# Patient Record
Sex: Male | Born: 1970 | Race: White | Hispanic: No
Health system: Southern US, Community
[De-identification: ages and names within clinical notes are randomized; demographics above are authoritative.]

---

## 2007-07-25 ENCOUNTER — Inpatient Hospital Stay: Payer: Self-pay | Admitting: Surgery

## 2007-08-10 ENCOUNTER — Ambulatory Visit: Payer: Self-pay | Admitting: Surgery

## 2007-10-19 ENCOUNTER — Ambulatory Visit: Payer: Self-pay | Admitting: Surgery

## 2008-12-29 IMAGING — CR DG CHEST 1V PORT
1 series · 1 of 1 positions shown · non-contrast
Comparison: none

REASON FOR EXAM: PTX
COMMENTS:

PROCEDURE:     DXR - DXR PORTABLE CHEST SINGLE VIEW  - July 31, 2007  [DATE]
RESULT:     Comparison: 07/31/2007 at [DATE].

[view not recorded]
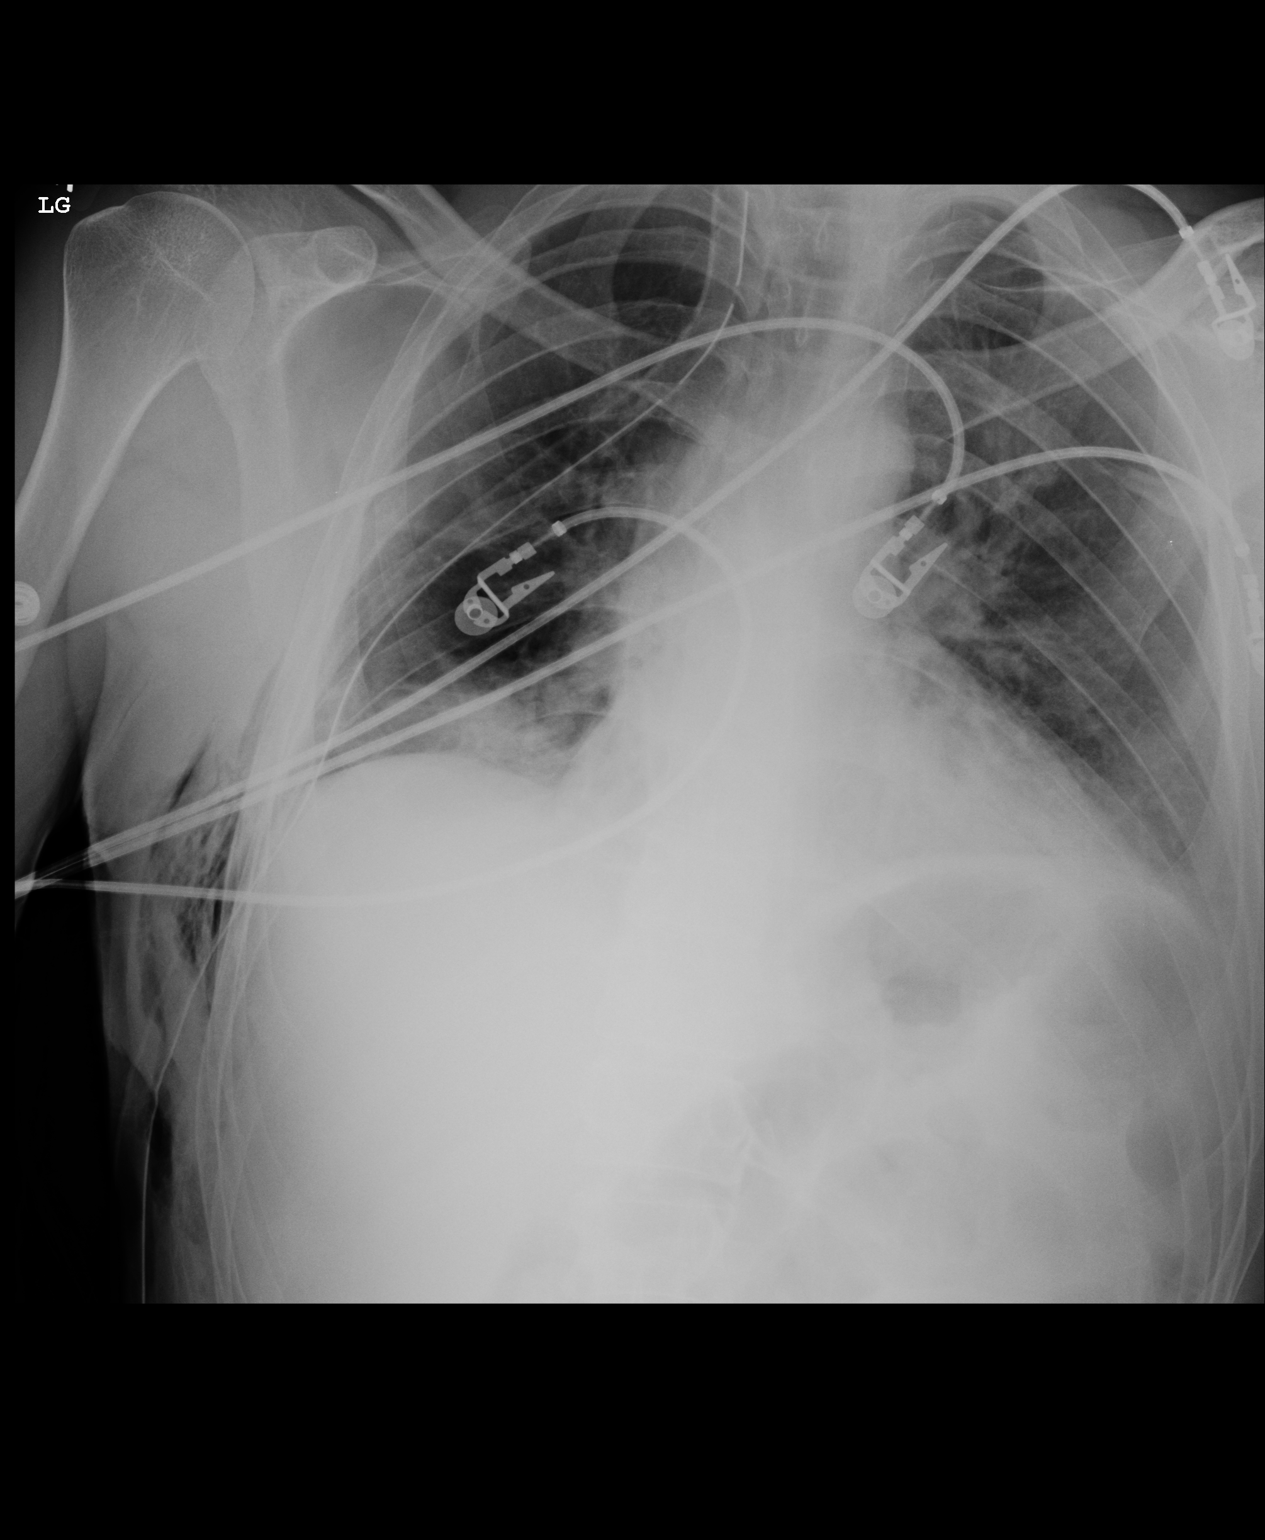

[1 of 1 positions shown; findings below may reference images not displayed]

FINDINGS: New right chest tube with tip at the apical right hemithorax. No significant
residual pneumothorax. Associated subcutaneous gas is noted. Bibasilar
opacities are likely due to atelectasis although superimposed pneumonia is
not excluded. No significant pulmonary edema. Stable cardiomediastinal
silhouette.
IMPRESSION: 1. Please see above.

## 2008-12-29 IMAGING — CR DG CHEST 1V PORT
1 series · 1 of 1 positions shown · non-contrast
Comparison: none

REASON FOR EXAM: ptx
COMMENTS:

PROCEDURE:     DXR - DXR PORTABLE CHEST SINGLE VIEW  - July 31, 2007  [DATE]
RESULT:     Comparison: 07/30/2007.

[view not recorded]
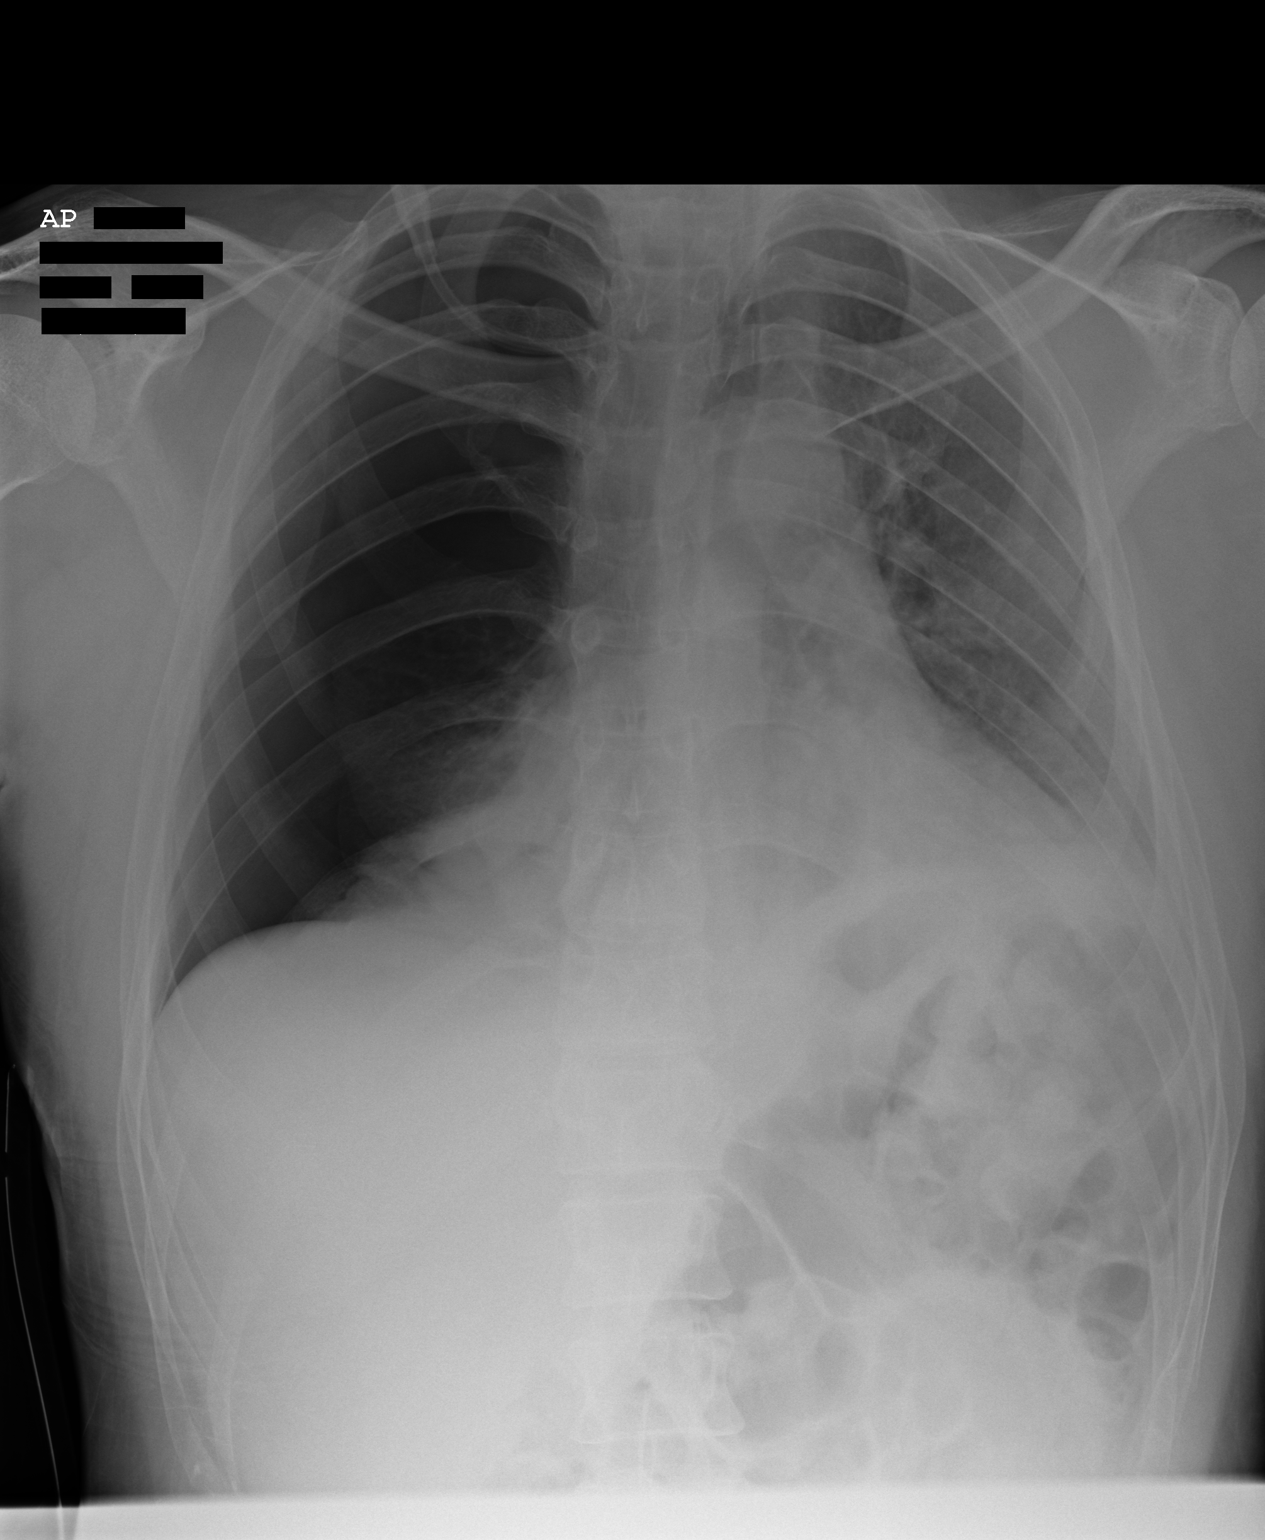

[1 of 1 positions shown; findings below may reference images not displayed]

FINDINGS: Interval removal of right chest tube. Right pneumothorax has increased to
large. Medial right basilar opacities are likely due to collapsed lung. The
left lung is clear. Patient is rotated. There is likely no significant
mediastinal shift.
IMPRESSION: 1. Interval removal of right chest tube. Right pneumothorax has increased to
large.

Findings were discussed with Dr. Mdjabed at [DATE] on 07/31/2007.

## 2008-12-30 IMAGING — CR DG CHEST 1V PORT
1 series · 1 of 1 positions shown · non-contrast
Comparison: none

REASON FOR EXAM: PTX
COMMENTS:

[view not recorded]
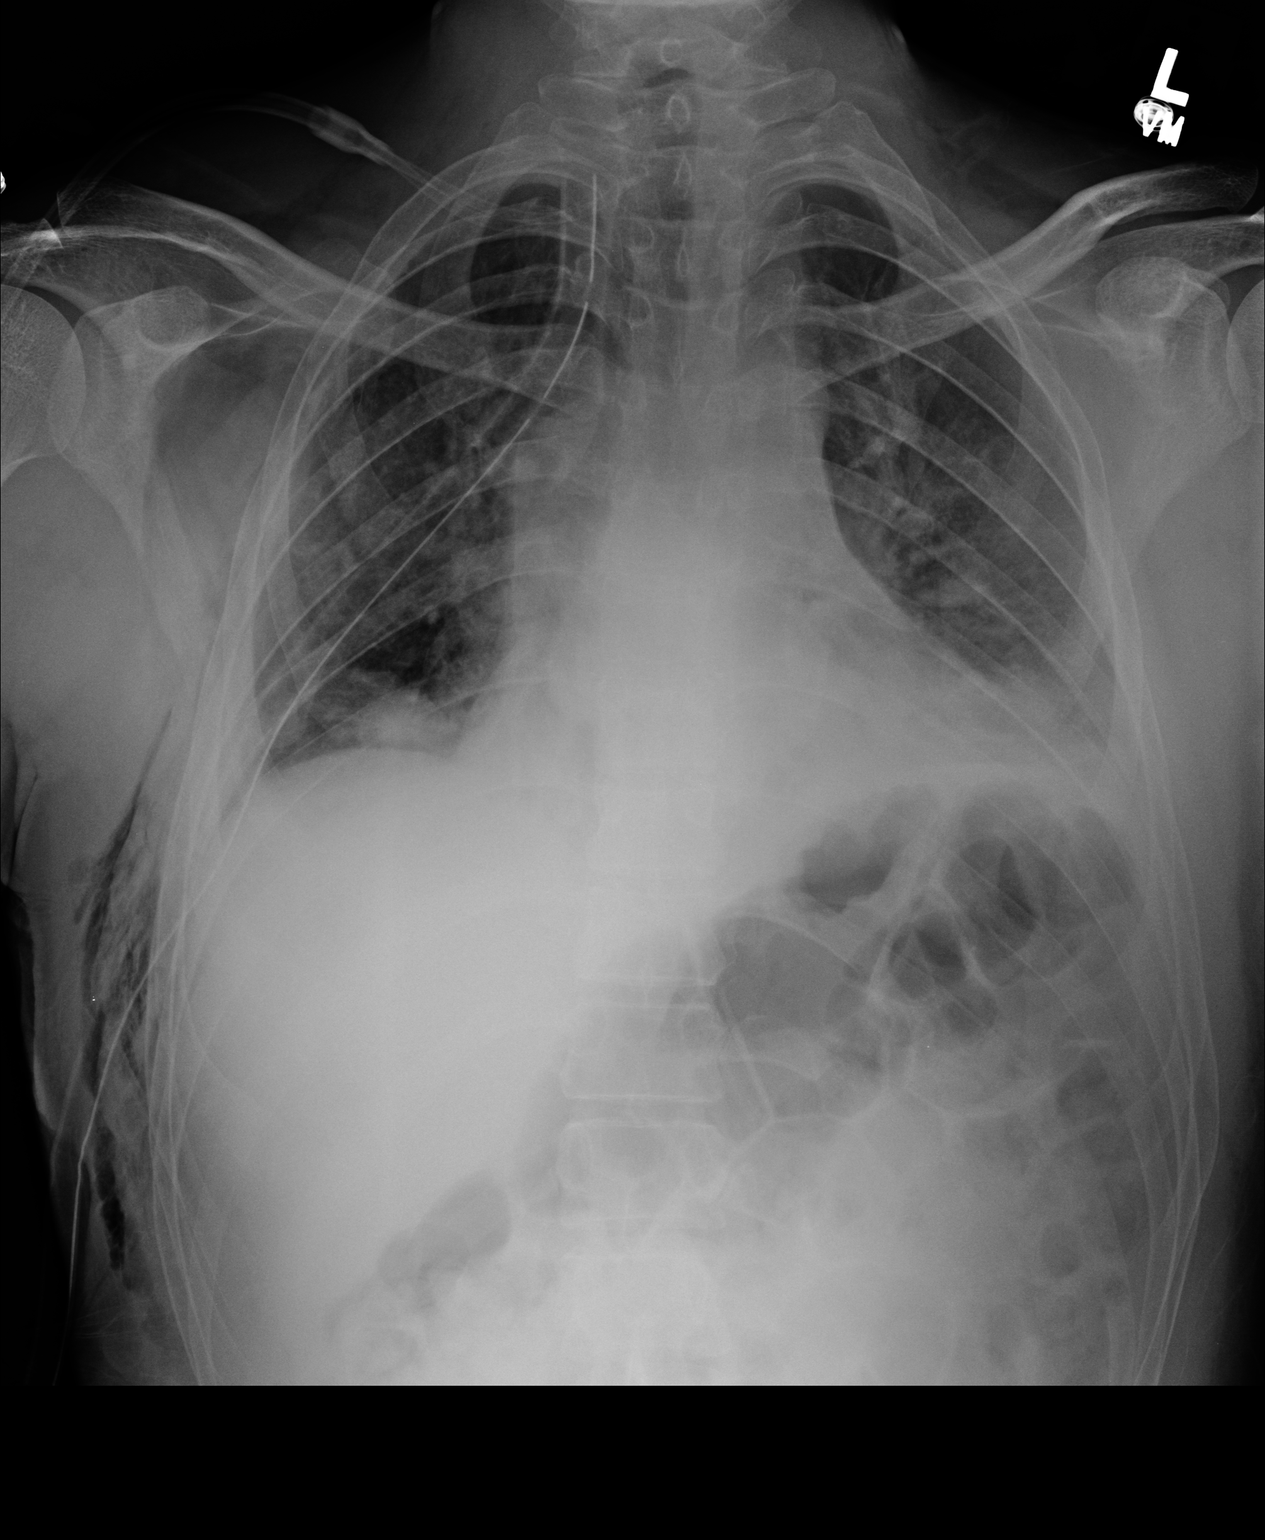

[1 of 1 positions shown; findings below may reference images not displayed]

PROCEDURE:     DXR - DXR PORTABLE CHEST SINGLE VIEW  - August 01, 2007  [DATE]

RESULT:     Comparison is made to a prior exam of 07/31/2007.

A RIGHT chest tube is present. No definite residual or recurrent RIGHT
pneumothorax is seen. There is increased density at both lung bases
compatible with bibasilar atelectasis. The atelectasis on the LEFT has
increased since the prior exam. The heart size is normal. No pleural
effusion is seen. Subcutaneous emphysema is present along the RIGHT lateral
hemithorax.
IMPRESSION: 1.  Please see above.
2.  No residual or recurrent pneumothorax is seen.

## 2009-01-01 IMAGING — CR DG CHEST 1V PORT
1 series · 1 of 1 positions shown · non-contrast
Comparison: none

REASON FOR EXAM: PTX
COMMENTS:

[view not recorded]
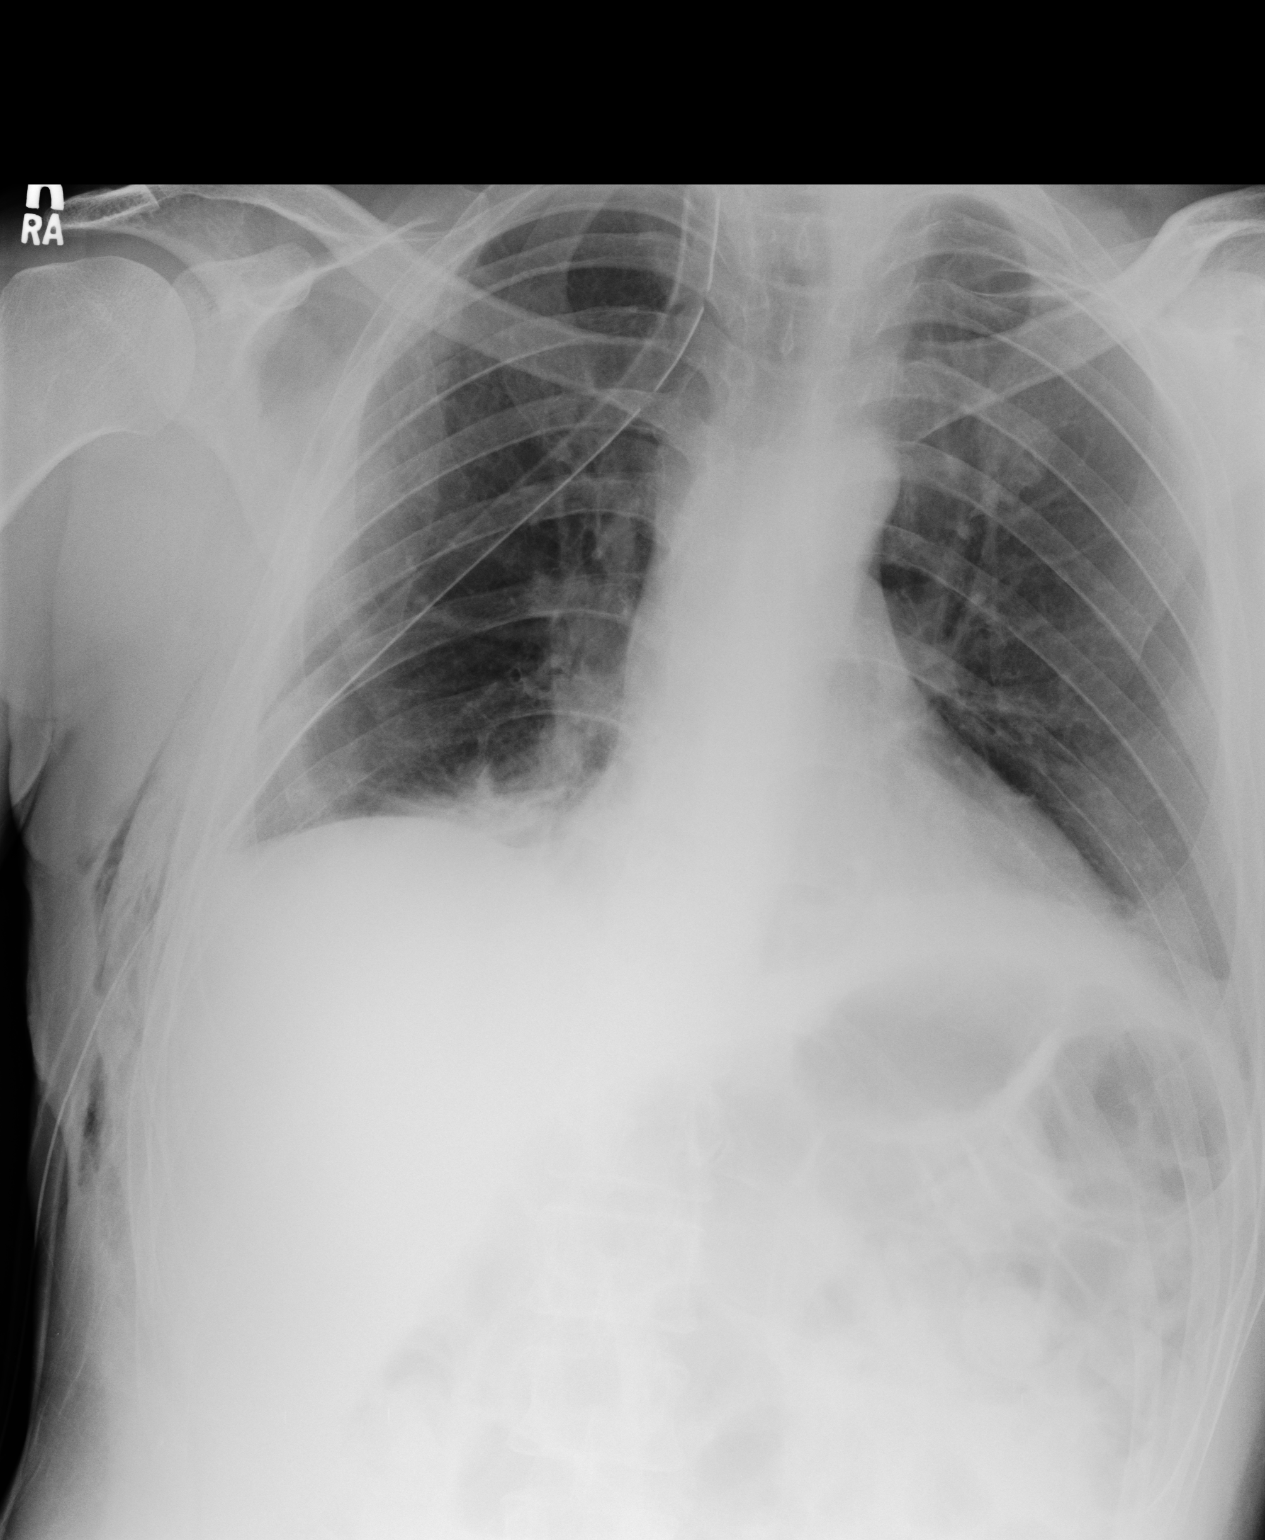

[1 of 1 positions shown; findings below may reference images not displayed]

PROCEDURE:     DXR - DXR PORTABLE CHEST SINGLE VIEW  - August 03, 2007  [DATE]

RESULT:     Comparison is made to a prior exam of 08/01/2007.

A RIGHT chest tube remains present with the tip projected at the RIGHT apex.
No recurrent or residual pneumothorax is seen. The previously present
bibasilar atelectasis shows improvement as compared to the exam of
08/01/2007. Subcutaneous emphysema is again seen along the RIGHT lateral
hemithorax but is less prominent than on the prior exam.
IMPRESSION: No pneumothorax is identified at this time.

## 2009-01-03 IMAGING — CR DG CHEST 1V PORT
1 series · 1 of 1 positions shown · non-contrast
Comparison: none

REASON FOR EXAM: PTX
COMMENTS:

[view not recorded]
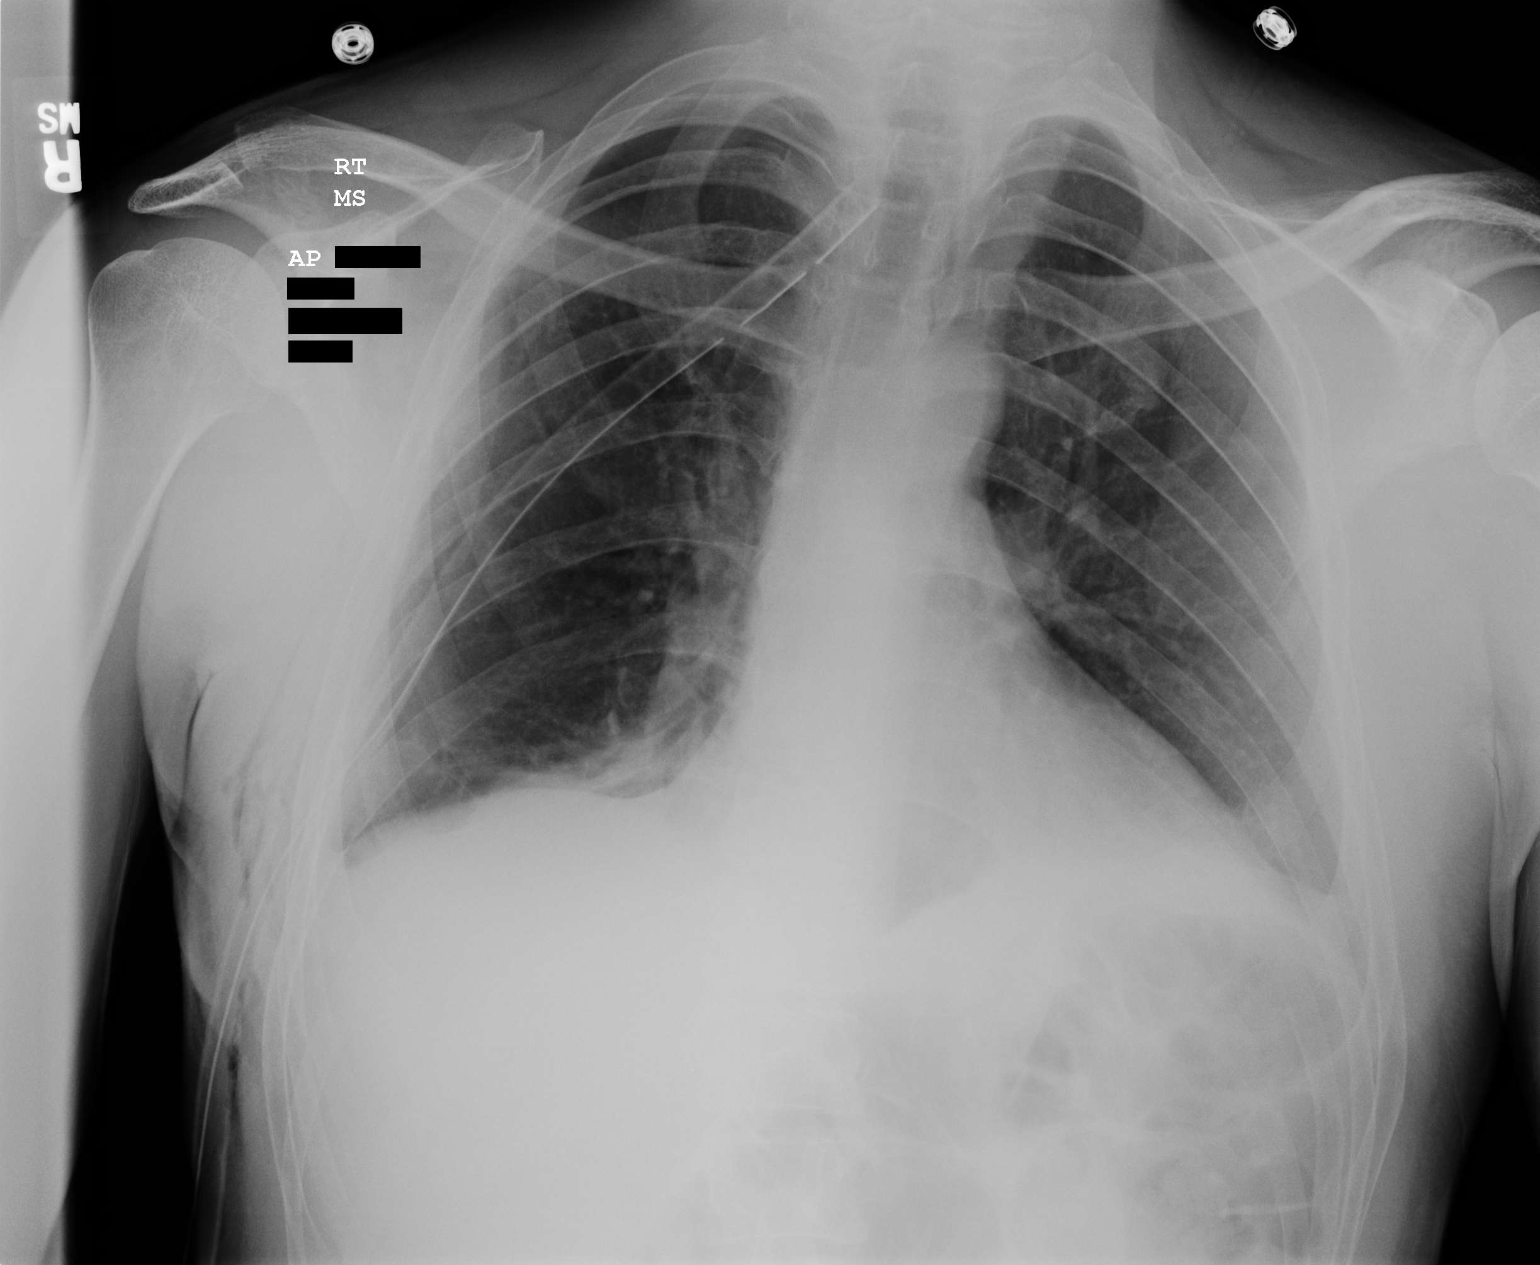

[1 of 1 positions shown; findings below may reference images not displayed]

PROCEDURE:     DXR - DXR PORTABLE CHEST SINGLE VIEW  - August 05, 2007  [DATE]

RESULT:     Comparison is made to the prior exam of 08/04/2007. There is a
1.25 cm RIGHT apical pneumothorax. A RIGHT chest tube remains present. There
is bibasilar atelectasis that appears improved. No new pulmonary infiltrates
are seen. There is a small amount of subcutaneous emphysema along the RIGHT
lateral hemithorax.
IMPRESSION: 1.     Trace RIGHT apical pneumothorax as noted above.

## 2013-03-30 ENCOUNTER — Ambulatory Visit: Payer: Self-pay | Admitting: Unknown Physician Specialty

## 2019-08-13 ENCOUNTER — Ambulatory Visit: Payer: Self-pay | Attending: Internal Medicine

## 2019-08-13 DIAGNOSIS — Z23 Encounter for immunization: Secondary | ICD-10-CM

## 2019-08-13 NOTE — Progress Notes (Signed)
   Covid-19 Vaccination Clinic  Name:  Jasper Berrelleza    MRN: [id number] DOB: [birth year]  08/13/2019  Mr. Ovens was observed post Covid-19 immunization for 15 minutes without incident. He was provided with Vaccine Information Sheet and instruction to access the V-Safe system.   Mr. Mckiernan was instructed to call 911 with any severe reactions post vaccine: Marland Kitchen Difficulty breathing  . Swelling of face and throat  . A fast heartbeat  . A bad rash all over body  . Dizziness and weakness   Immunizations Administered    Name Date Dose VIS Date Route   Pfizer COVID-19 Vaccine 08/13/2019 12:25 PM 0.3 mL 05/05/2019 Intramuscular   Manufacturer: [place]   Lot: [id number]   [place]: [id number]

## 2019-09-03 ENCOUNTER — Ambulatory Visit: Payer: Self-pay | Attending: Internal Medicine

## 2019-09-03 DIAGNOSIS — Z23 Encounter for immunization: Secondary | ICD-10-CM

## 2019-09-03 NOTE — Progress Notes (Signed)
   Covid-19 Vaccination Clinic  Name:  Jesse Valdez    MRN: [id number] DOB: 1970-12-01  09/03/2019  [name] was observed post Covid-19 immunization for 15 minutes without incident. He was provided with Vaccine Information Sheet and instruction to access the V-Safe system.   [name] was instructed to call 911 with any severe reactions post vaccine: Marland Kitchen Difficulty breathing  . Swelling of face and throat  . A fast heartbeat  . A bad rash all over body  . Dizziness and weakness   Immunizations Administered    Name Date Dose VIS Date Route   Pfizer COVID-19 Vaccine 09/03/2019 12:18 PM 0.3 mL 05/05/2019 Intramuscular   Manufacturer: [place]   Lot: [id number]   [place]: [id number]

## 2022-06-04 ENCOUNTER — Ambulatory Visit: Payer: BC Managed Care – PPO | Admitting: Dermatology

## 2022-06-04 DIAGNOSIS — Z79899 Other long term (current) drug therapy: Secondary | ICD-10-CM

## 2022-06-04 DIAGNOSIS — L720 Epidermal cyst: Secondary | ICD-10-CM

## 2022-06-04 DIAGNOSIS — L814 Other melanin hyperpigmentation: Secondary | ICD-10-CM | POA: Diagnosis not present

## 2022-06-04 DIAGNOSIS — L578 Other skin changes due to chronic exposure to nonionizing radiation: Secondary | ICD-10-CM | POA: Diagnosis not present

## 2022-06-04 DIAGNOSIS — D229 Melanocytic nevi, unspecified: Secondary | ICD-10-CM

## 2022-06-04 DIAGNOSIS — Z1283 Encounter for screening for malignant neoplasm of skin: Secondary | ICD-10-CM | POA: Diagnosis not present

## 2022-06-04 DIAGNOSIS — D1801 Hemangioma of skin and subcutaneous tissue: Secondary | ICD-10-CM

## 2022-06-04 MED ORDER — TRETINOIN 0.01 % EX GEL: Freq: Every day | CUTANEOUS | 3 refills | Status: AC

## 2022-06-04 NOTE — Progress Notes (Signed)
   New Patient Visit  Subjective  Jesse Valdez is a 52 y.o. male who presents for the following: Annual Exam (No history of skin cancer - The patient presents for Total-Body Skin Exam (TBSE) for skin cancer screening and mole check.  The patient has spots, moles and lesions to be evaluated, some may be new or changing and the patient has concerns that these could be cancer./).  The following portions of the chart were reviewed this encounter and updated as appropriate:   Allergies  Meds  Problems  Med Hx  Surg Hx  Fam Hx     Review of Systems:  No other skin or systemic complaints except as noted in HPI or Assessment and Plan.  Objective  Well appearing patient in no apparent distress; mood and affect are within normal limits.  A full examination was performed including scalp, head, eyes, ears, nose, lips, neck, chest, axillae, abdomen, back, buttocks, bilateral upper extremities, bilateral lower extremities, hands, feet, fingers, toes, fingernails, and toenails. All findings within normal limits unless otherwise noted below.  Face Smooth white papule(s).    Assessment & Plan   Lentigines - Scattered tan macules - Due to sun exposure - Benign-appearing, observe - Recommend daily broad spectrum sunscreen SPF 30+ to sun-exposed areas, reapply every 2 hours as needed. - Call for any changes  Seborrheic Keratoses - Stuck-on, waxy, tan-brown papules and/or plaques  - Benign-appearing - Discussed benign etiology and prognosis. - Observe - Call for any changes  Melanocytic Nevi - Tan-brown and/or pink-flesh-colored symmetric macules and papules - Benign appearing on exam today - Observation - Call clinic for new or changing moles - Recommend daily use of broad spectrum spf 30+ sunscreen to sun-exposed areas.   Hemangiomas - Red papules - Discussed benign nature - Observe - Call for any changes  Actinic Damage - Chronic condition, secondary to cumulative UV/sun  exposure - diffuse scaly erythematous macules with underlying dyspigmentation - Recommend daily broad spectrum sunscreen SPF 30+ to sun-exposed areas, reapply every 2 hours as needed.  - Staying in the shade or wearing long sleeves, sun glasses (UVA+UVB protection) and wide brim hats (4-inch brim around the entire circumference of the hat) are also recommended for sun protection.  - Call for new or changing lesions.  Skin cancer screening performed today.  Milia Face Chronic and persistent condition with duration or expected duration over one year. Condition is symptomatic / bothersome to patient. Not to goal. Start Tretinoin 0.1% cream qhs  tretinoin (RETIN-A) 0.01 % gel - Face Apply topically at bedtime.  Return in about 2 years (around 06/04/2024) for TBSE.  I, Ashok Cordia, CMA, am acting as scribe for Sarina Ser, MD . Documentation: I have reviewed the above documentation for accuracy and completeness, and I agree with the above.  Sarina Ser, MD

## 2022-06-04 NOTE — Patient Instructions (Signed)
Due to recent changes in healthcare laws, you may see results of your pathology and/or laboratory studies on MyChart before the doctors have had a chance to review them. We understand that in some cases there may be results that are confusing or concerning to you. Please understand that not all results are received at the same time and often the doctors may need to interpret multiple results in order to provide you with the best plan of care or course of treatment. Therefore, we ask that you please give us 2 business days to thoroughly review all your results before contacting the office for clarification. Should we see a critical lab result, you will be contacted sooner.   If You Need Anything After Your Visit  If you have any questions or concerns for your doctor, please call our main line at [phone] and press option 4 to reach your doctor's medical assistant. If no one answers, please leave a voicemail as directed and we will return your call as soon as possible. Messages left after 4 pm will be answered the following business day.   You may also send us a message via MyChart. We typically respond to MyChart messages within 1-2 business days.  For prescription refills, please ask your pharmacy to contact our office. Our fax number is [phone].  If you have an urgent issue when the clinic is closed that cannot wait until the next business day, you can page your doctor at the number below.    Please note that while we do our best to be available for urgent issues outside of office hours, we are not available 24/7.   If you have an urgent issue and are unable to reach us, you may choose to seek medical care at your doctor's office, retail clinic, urgent care center, or emergency room.  If you have a medical emergency, please immediately call 911 or go to the emergency department.  Pager Numbers  - Dr. Kowalski: [phone]  - Dr. Moye: [phone]  - Dr. Stewart:  [phone]  In the event of inclement weather, please call our main line at [phone] for an update on the status of any delays or closures.  Dermatology Medication Tips: Please keep the boxes that topical medications come in in order to help keep track of the instructions about where and how to use these. Pharmacies typically print the medication instructions only on the boxes and not directly on the medication tubes.   If your medication is too expensive, please contact our office at [phone] option 4 or send us a message through MyChart.   We are unable to tell what your co-pay for medications will be in advance as this is different depending on your insurance coverage. However, we may be able to find a substitute medication at lower cost or fill out paperwork to get insurance to cover a needed medication.   If a prior authorization is required to get your medication covered by your insurance company, please allow us 1-2 business days to complete this process.  Drug prices often vary depending on where the prescription is filled and some pharmacies may offer cheaper prices.  The website www.goodrx.com contains coupons for medications through different pharmacies. The prices here do not account for what the cost may be with help from insurance (it may be cheaper with your insurance), but the website can give you the price if you did not use any insurance.  - You can print the associated coupon and take it with   your prescription to the pharmacy.  - You may also stop by our office during regular business hours and pick up a GoodRx coupon card.  - If you need your prescription sent electronically to a different pharmacy, notify our office through [facility] MyChart or by phone at [phone] option 4.     Si Usted Necesita Algo Despus de Su Visita  Tambin puede enviarnos un mensaje a travs de MyChart. Por lo general respondemos a los mensajes de MyChart en el transcurso de 1 a 2  das hbiles.  Para renovar recetas, por favor pida a su farmacia que se ponga en contacto con nuestra oficina. Nuestro nmero de fax es el [phone].  Si tiene un asunto urgente cuando la clnica est cerrada y que no puede esperar hasta el siguiente da hbil, puede llamar/localizar a su doctor(a) al nmero que aparece a continuacin.   Por favor, tenga en cuenta que aunque hacemos todo lo posible para estar disponibles para asuntos urgentes fuera del horario de oficina, no estamos disponibles las 24 horas del da, los 7 das de la semana.   Si tiene un problema urgente y no puede comunicarse con nosotros, puede optar por buscar atencin mdica  en el consultorio de su doctor(a), en una clnica privada, en un centro de atencin urgente o en una sala de emergencias.  Si tiene una emergencia mdica, por favor llame inmediatamente al 911 o vaya a la sala de emergencias.  Nmeros de bper  - Dr. Kowalski: [phone]  - Dra. Moye: [phone]  - Dra. Stewart: [phone]  En caso de inclemencias del tiempo, por favor llame a nuestra lnea principal al [phone] para una actualizacin sobre el estado de cualquier retraso o cierre.  Consejos para la medicacin en dermatologa: Por favor, guarde las cajas en las que vienen los medicamentos de uso tpico para ayudarle a seguir las instrucciones sobre dnde y cmo usarlos. Las farmacias generalmente imprimen las instrucciones del medicamento slo en las cajas y no directamente en los tubos del medicamento.   Si su medicamento es muy caro, por favor, pngase en contacto con nuestra oficina llamando al [phone] y presione la opcin 4 o envenos un mensaje a travs de MyChart.   No podemos decirle cul ser su copago por los medicamentos por adelantado ya que esto es diferente dependiendo de la cobertura de su seguro. Sin embargo, es posible que podamos encontrar un medicamento sustituto a menor costo o llenar un formulario para que el  seguro cubra el medicamento que se considera necesario.   Si se requiere una autorizacin previa para que su compaa de seguros cubra su medicamento, por favor permtanos de 1 a 2 das hbiles para completar este proceso.  Los precios de los medicamentos varan con frecuencia dependiendo del lugar de dnde se surte la receta y alguna farmacias pueden ofrecer precios ms baratos.  El sitio web www.goodrx.com tiene cupones para medicamentos de diferentes farmacias. Los precios aqu no tienen en cuenta lo que podra costar con la ayuda del seguro (puede ser ms barato con su seguro), pero el sitio web puede darle el precio si no utiliz ningn seguro.  - Puede imprimir el cupn correspondiente y llevarlo con su receta a la farmacia.  - Tambin puede pasar por nuestra oficina durante el horario de atencin regular y recoger una tarjeta de cupones de GoodRx.  - Si necesita que su receta se enve electrnicamente a una farmacia diferente, informe a nuestra oficina a travs de MyChart de [facility]   o por telfono llamando al [phone] y presione la opcin 4.  

## 2022-06-09 ENCOUNTER — Encounter: Payer: Self-pay | Admitting: Dermatology

## 2023-11-16 ENCOUNTER — Encounter: Payer: Self-pay | Admitting: Dermatology

## 2023-11-16 ENCOUNTER — Ambulatory Visit: Admitting: Dermatology

## 2023-11-16 DIAGNOSIS — L820 Inflamed seborrheic keratosis: Secondary | ICD-10-CM

## 2023-11-16 DIAGNOSIS — D492 Neoplasm of unspecified behavior of bone, soft tissue, and skin: Secondary | ICD-10-CM | POA: Diagnosis not present

## 2023-11-16 DIAGNOSIS — L814 Other melanin hyperpigmentation: Secondary | ICD-10-CM | POA: Diagnosis not present

## 2023-11-16 DIAGNOSIS — D1801 Hemangioma of skin and subcutaneous tissue: Secondary | ICD-10-CM

## 2023-11-16 DIAGNOSIS — L578 Other skin changes due to chronic exposure to nonionizing radiation: Secondary | ICD-10-CM | POA: Diagnosis not present

## 2023-11-16 DIAGNOSIS — Z1283 Encounter for screening for malignant neoplasm of skin: Secondary | ICD-10-CM

## 2023-11-16 DIAGNOSIS — L918 Other hypertrophic disorders of the skin: Secondary | ICD-10-CM

## 2023-11-16 DIAGNOSIS — D485 Neoplasm of uncertain behavior of skin: Secondary | ICD-10-CM

## 2023-11-16 DIAGNOSIS — W908XXA Exposure to other nonionizing radiation, initial encounter: Secondary | ICD-10-CM

## 2023-11-16 DIAGNOSIS — L821 Other seborrheic keratosis: Secondary | ICD-10-CM

## 2023-11-16 DIAGNOSIS — D229 Melanocytic nevi, unspecified: Secondary | ICD-10-CM

## 2023-11-16 NOTE — Patient Instructions (Addendum)

## 2023-11-16 NOTE — Progress Notes (Signed)
 Follow-Up Visit   Subjective  Jesse Valdez is a 53 y.o. male who presents for the following: Skin Cancer Screening and Full Body Skin Exam  The patient presents for Total-Body Skin Exam (TBSE) for skin cancer screening and mole check. The patient has spots, moles and lesions to be evaluated, some may be new or changing and the patient may have concern these could be cancer.   The following portions of the chart were reviewed this encounter and updated as appropriate: medications, allergies, medical history  Review of Systems:  No other skin or systemic complaints except as noted in HPI or Assessment and Plan.  Objective  Well appearing patient in no apparent distress; mood and affect are within normal limits.  A full examination was performed including scalp, head, eyes, ears, nose, lips, neck, chest, axillae, abdomen, back, buttocks, bilateral upper extremities, bilateral lower extremities, hands, feet, fingers, toes, fingernails, and toenails. All findings within normal limits unless otherwise noted below.   Relevant physical exam findings are noted in the Assessment and Plan.  L forehead x 1, L upper back x 2, R temple x 2, chest and abdomen x 6 (11) Erythematous stuck-on, waxy papule or plaque R ant waistline 0.6 cm irregular brown macule.   Assessment & Plan   SKIN CANCER SCREENING PERFORMED TODAY.  ACTINIC DAMAGE - Chronic condition, secondary to cumulative UV/sun exposure - diffuse scaly erythematous macules with underlying dyspigmentation - Recommend daily broad spectrum sunscreen SPF 30+ to sun-exposed areas, reapply every 2 hours as needed.  - Staying in the shade or wearing long sleeves, sun glasses (UVA+UVB protection) and wide brim hats (4-inch brim around the entire circumference of the hat) are also recommended for sun protection.  - Call for new or changing lesions.  LENTIGINES, SEBORRHEIC KERATOSES, HEMANGIOMAS - Benign normal skin lesions -  Benign-appearing - Call for any changes  MELANOCYTIC NEVI - Tan-brown and/or pink-flesh-colored symmetric macules and papules - Benign appearing on exam today - Observation - Call clinic for new or changing moles - Recommend daily use of broad spectrum spf 30+ sunscreen to sun-exposed areas.  INFLAMED SEBORRHEIC KERATOSIS (11) L forehead x 1, L upper back x 2, R temple x 2, chest and abdomen x 6 (11) Symptomatic, irritating, patient would like treated.  Destruction of lesion - L forehead x 1, L upper back x 2, R temple x 2, chest and abdomen x 6 (11) Complexity: simple   Destruction method: cryotherapy   Informed consent: discussed and consent obtained   Timeout:  patient name, date of birth, surgical site, and procedure verified Lesion destroyed using liquid nitrogen: Yes   Region frozen until ice ball extended beyond lesion: Yes   Outcome: patient tolerated procedure well with no complications   Post-procedure details: wound care instructions given   NEOPLASM OF UNCERTAIN BEHAVIOR OF SKIN R ant waistline Epidermal / dermal shaving  Lesion diameter (cm):  0.6 Informed consent: discussed and consent obtained   Timeout: patient name, date of birth, surgical site, and procedure verified   Procedure prep:  Patient was prepped and draped in usual sterile fashion Prep type:  Isopropyl alcohol Anesthesia: the lesion was anesthetized in a standard fashion   Anesthetic:  1% lidocaine w/ epinephrine 1-100,000 buffered w/ 8.4% NaHCO3 Instrument used: flexible razor blade   Hemostasis achieved with: pressure, aluminum chloride and electrodesiccation   Outcome: patient tolerated procedure well   Post-procedure details: sterile dressing applied and wound care instructions given   Dressing type: bandage (Mupirocin  2% ointment)   Specimen 1 - Surgical pathology Differential Diagnosis: D48.5 r/o dysplastic nevus vs ISK Check Margins: Yes  Acrochordons (Skin Tags) - Fleshy, skin-colored  pedunculated papules - Benign appearing.  - Observe. - If desired, they can be removed with an in office procedure that is not covered by insurance. - Please call the clinic if you notice any new or changing lesions.  Return in about 1 year (around 11/15/2024) for TBSE.  [name], CMA, am acting as scribe for Alm Rhyme, MD .   Documentation: I have reviewed the above documentation for accuracy and completeness, and I agree with the above.  Alm Rhyme, MD

## 2023-11-17 ENCOUNTER — Telehealth: Payer: Self-pay

## 2023-11-17 NOTE — Telephone Encounter (Signed)
 Patient returned phone call and left message stating he will leave place on chest and if it starts to bother him he will call back to schedule an appointment to have it taken care.

## 2023-11-17 NOTE — Telephone Encounter (Signed)
 Left message for patient to return my call. Per Dr. Hester, I did not treat the ISKs on this pts L chest. I called and left a message Tuesday PM. Please call him Wed and ask if he wants treated -- we can have him come in this week to do that OR if not that's OK.

## 2023-11-18 LAB — SURGICAL PATHOLOGY

## 2023-11-23 ENCOUNTER — Ambulatory Visit: Payer: Self-pay | Admitting: Dermatology

## 2023-11-23 NOTE — Telephone Encounter (Addendum)
 Called and discussed results with patient he verbalized understanding and denied further questions.   ----- Message from Alm Rhyme sent at 11/23/2023  9:50 AM EDT ----- FINAL DIAGNOSIS        1. Skin, R ant waistline :       LENTIGO, LIMITED MARGINS FREE   Benign Lentigo (= Freckle) No further treatment needed ----- Message ----- From: Interface, Lab In [facility] Sent: 11/18/2023   4:17 PM EDT To: Alm JAYSON Rhyme, MD

## 2024-11-15 ENCOUNTER — Ambulatory Visit: Admitting: Dermatology
